# Patient Record
Sex: Female | Born: 1970
Health system: Southern US, Community
[De-identification: ages and names within clinical notes are randomized; demographics above are authoritative.]

## PROBLEM LIST (undated history)

## (undated) DIAGNOSIS — D6852 Prothrombin gene mutation: Secondary | ICD-10-CM

## (undated) DIAGNOSIS — I2699 Other pulmonary embolism without acute cor pulmonale: Secondary | ICD-10-CM

## (undated) DIAGNOSIS — E70329 Oculocutaneous albinism, unspecified: Secondary | ICD-10-CM

## (undated) DIAGNOSIS — D689 Coagulation defect, unspecified: Secondary | ICD-10-CM

## (undated) DIAGNOSIS — I80209 Phlebitis and thrombophlebitis of unspecified deep vessels of unspecified lower extremity: Secondary | ICD-10-CM

## (undated) DIAGNOSIS — G43009 Migraine without aura, not intractable, without status migrainosus: Secondary | ICD-10-CM

## (undated) DIAGNOSIS — E538 Deficiency of other specified B group vitamins: Secondary | ICD-10-CM

## (undated) DIAGNOSIS — J329 Chronic sinusitis, unspecified: Secondary | ICD-10-CM

## (undated) DIAGNOSIS — Z22322 Carrier or suspected carrier of Methicillin resistant Staphylococcus aureus: Secondary | ICD-10-CM

## (undated) DIAGNOSIS — G8929 Other chronic pain: Secondary | ICD-10-CM

## (undated) DIAGNOSIS — G47419 Narcolepsy without cataplexy: Secondary | ICD-10-CM

## (undated) DIAGNOSIS — G4700 Insomnia, unspecified: Secondary | ICD-10-CM

## (undated) DIAGNOSIS — R51 Headache: Secondary | ICD-10-CM

## (undated) DIAGNOSIS — Z86718 Personal history of other venous thrombosis and embolism: Secondary | ICD-10-CM

## (undated) DIAGNOSIS — G894 Chronic pain syndrome: Secondary | ICD-10-CM

## (undated) DIAGNOSIS — G500 Trigeminal neuralgia: Secondary | ICD-10-CM

## (undated) DIAGNOSIS — F419 Anxiety disorder, unspecified: Secondary | ICD-10-CM

## (undated) DIAGNOSIS — J322 Chronic ethmoidal sinusitis: Secondary | ICD-10-CM

## (undated) DIAGNOSIS — F988 Other specified behavioral and emotional disorders with onset usually occurring in childhood and adolescence: Secondary | ICD-10-CM

## (undated) DIAGNOSIS — E039 Hypothyroidism, unspecified: Secondary | ICD-10-CM

## (undated) DIAGNOSIS — Z923 Personal history of irradiation: Secondary | ICD-10-CM

## (undated) HISTORY — PX: EYE SURGERY: SHX253

## (undated) HISTORY — PX: ABDOMINAL HYSTERECTOMY: SHX81

## (undated) HISTORY — DX: Personal history of irradiation: Z92.3

## (undated) HISTORY — DX: Hypothyroidism, unspecified: E03.9

## (undated) HISTORY — DX: Insomnia, unspecified: G47.00

## (undated) HISTORY — DX: Coagulation defect, unspecified: D68.9

## (undated) HISTORY — DX: Chronic sinusitis, unspecified: J32.9

## (undated) HISTORY — DX: Other pulmonary embolism without acute cor pulmonale: I26.99

## (undated) HISTORY — DX: Chronic pain syndrome: G89.4

## (undated) HISTORY — DX: Migraine without aura, not intractable, without status migrainosus: G43.009

## (undated) HISTORY — DX: Narcolepsy without cataplexy: G47.419

## (undated) HISTORY — DX: Phlebitis and thrombophlebitis of unspecified deep vessels of unspecified lower extremity: I80.209

## (undated) HISTORY — DX: Deficiency of other specified B group vitamins: E53.8

## (undated) HISTORY — DX: Anxiety disorder, unspecified: F41.9

## (undated) HISTORY — DX: Oculocutaneous albinism, unspecified: E70.329

## (undated) HISTORY — DX: Other specified behavioral and emotional disorders with onset usually occurring in childhood and adolescence: F98.8

## (undated) HISTORY — DX: Chronic ethmoidal sinusitis: J32.2

## (undated) HISTORY — DX: Prothrombin gene mutation: D68.52

## (undated) HISTORY — DX: Personal history of other venous thrombosis and embolism: Z86.718

## (undated) HISTORY — PX: NASAL SINUS SURGERY: SHX719

## (undated) HISTORY — DX: Trigeminal neuralgia: G50.0

---

## 2004-02-24 ENCOUNTER — Other Ambulatory Visit: Admission: RE | Admit: 2004-02-24 | Discharge: 2004-02-24 | Payer: Self-pay | Admitting: *Deleted

## 2004-05-05 ENCOUNTER — Ambulatory Visit: Payer: Self-pay | Admitting: Oncology

## 2004-06-23 ENCOUNTER — Ambulatory Visit: Payer: Self-pay | Admitting: Oncology

## 2004-07-18 ENCOUNTER — Ambulatory Visit (HOSPITAL_COMMUNITY): Admission: RE | Admit: 2004-07-18 | Discharge: 2004-07-18 | Payer: Self-pay | Admitting: Oncology

## 2004-08-11 ENCOUNTER — Ambulatory Visit: Payer: Self-pay | Admitting: Dentistry

## 2004-08-11 ENCOUNTER — Encounter: Admission: RE | Admit: 2004-08-11 | Discharge: 2004-08-11 | Payer: Self-pay | Admitting: Dentistry

## 2004-08-31 ENCOUNTER — Ambulatory Visit: Payer: Self-pay | Admitting: Oncology

## 2004-10-24 ENCOUNTER — Ambulatory Visit: Payer: Self-pay | Admitting: Oncology

## 2004-12-21 ENCOUNTER — Ambulatory Visit: Payer: Self-pay | Admitting: Oncology

## 2005-01-26 ENCOUNTER — Other Ambulatory Visit: Admission: RE | Admit: 2005-01-26 | Discharge: 2005-01-26 | Payer: Self-pay | Admitting: Obstetrics and Gynecology

## 2005-02-16 ENCOUNTER — Encounter: Admission: RE | Admit: 2005-02-16 | Discharge: 2005-02-16 | Payer: Self-pay | Admitting: Oncology

## 2005-02-22 ENCOUNTER — Ambulatory Visit: Payer: Self-pay | Admitting: Oncology

## 2005-02-23 ENCOUNTER — Encounter: Admission: AD | Admit: 2005-02-23 | Discharge: 2005-02-23 | Payer: Self-pay | Admitting: Dentistry

## 2005-02-23 ENCOUNTER — Ambulatory Visit: Payer: Self-pay | Admitting: Dentistry

## 2005-07-06 ENCOUNTER — Ambulatory Visit: Payer: Self-pay | Admitting: Oncology

## 2005-10-05 ENCOUNTER — Ambulatory Visit: Payer: Self-pay | Admitting: Oncology

## 2006-01-25 ENCOUNTER — Ambulatory Visit: Payer: Self-pay | Admitting: Oncology

## 2006-04-26 ENCOUNTER — Ambulatory Visit: Payer: Self-pay | Admitting: Oncology

## 2006-07-12 ENCOUNTER — Ambulatory Visit: Payer: Self-pay | Admitting: Oncology

## 2006-10-04 ENCOUNTER — Ambulatory Visit: Payer: Self-pay | Admitting: Oncology

## 2007-01-01 ENCOUNTER — Ambulatory Visit: Payer: Self-pay | Admitting: Oncology

## 2007-10-13 ENCOUNTER — Ambulatory Visit (HOSPITAL_BASED_OUTPATIENT_CLINIC_OR_DEPARTMENT_OTHER): Admission: RE | Admit: 2007-10-13 | Discharge: 2007-10-16 | Payer: Self-pay

## 2007-10-13 ENCOUNTER — Encounter: Payer: Self-pay | Admitting: Internal Medicine

## 2007-10-14 ENCOUNTER — Encounter: Payer: Self-pay | Admitting: Internal Medicine

## 2007-10-16 ENCOUNTER — Ambulatory Visit: Payer: Self-pay | Admitting: Internal Medicine

## 2008-02-01 ENCOUNTER — Emergency Department (HOSPITAL_BASED_OUTPATIENT_CLINIC_OR_DEPARTMENT_OTHER): Admission: EM | Admit: 2008-02-01 | Discharge: 2008-02-02 | Payer: Self-pay | Admitting: Emergency Medicine

## 2008-02-02 ENCOUNTER — Ambulatory Visit: Payer: Self-pay | Admitting: Diagnostic Radiology

## 2008-02-14 ENCOUNTER — Emergency Department (HOSPITAL_BASED_OUTPATIENT_CLINIC_OR_DEPARTMENT_OTHER): Admission: EM | Admit: 2008-02-14 | Discharge: 2008-02-14 | Payer: Self-pay | Admitting: Emergency Medicine

## 2008-02-19 ENCOUNTER — Ambulatory Visit: Payer: Self-pay | Admitting: Diagnostic Radiology

## 2008-02-19 ENCOUNTER — Emergency Department (HOSPITAL_BASED_OUTPATIENT_CLINIC_OR_DEPARTMENT_OTHER): Admission: EM | Admit: 2008-02-19 | Discharge: 2008-02-19 | Payer: Self-pay | Admitting: Emergency Medicine

## 2008-03-07 ENCOUNTER — Emergency Department (HOSPITAL_BASED_OUTPATIENT_CLINIC_OR_DEPARTMENT_OTHER): Admission: EM | Admit: 2008-03-07 | Discharge: 2008-03-07 | Payer: Self-pay | Admitting: Emergency Medicine

## 2008-04-15 ENCOUNTER — Emergency Department (HOSPITAL_BASED_OUTPATIENT_CLINIC_OR_DEPARTMENT_OTHER): Admission: EM | Admit: 2008-04-15 | Discharge: 2008-04-15 | Payer: Self-pay | Admitting: Emergency Medicine

## 2008-04-25 ENCOUNTER — Emergency Department (HOSPITAL_BASED_OUTPATIENT_CLINIC_OR_DEPARTMENT_OTHER): Admission: EM | Admit: 2008-04-25 | Discharge: 2008-04-25 | Payer: Self-pay | Admitting: Emergency Medicine

## 2008-04-29 ENCOUNTER — Emergency Department (HOSPITAL_BASED_OUTPATIENT_CLINIC_OR_DEPARTMENT_OTHER): Admission: EM | Admit: 2008-04-29 | Discharge: 2008-04-29 | Payer: Self-pay | Admitting: Emergency Medicine

## 2008-06-11 ENCOUNTER — Emergency Department (HOSPITAL_BASED_OUTPATIENT_CLINIC_OR_DEPARTMENT_OTHER): Admission: EM | Admit: 2008-06-11 | Discharge: 2008-06-11 | Payer: Self-pay | Admitting: Emergency Medicine

## 2009-02-06 IMAGING — CT CT PARANASAL SINUSES LIMITED
3 series · 16 of 47 positions shown, 19 images · non-contrast
Comparison: 07/18/2004

CLINICAL DATA: Headache

CT PARANASAL SINUS LIMITED WITHOUT CONTRAST

[Series 2: limited sinus 3.0 h60s · axial · 0.34mm/px · z∈[-140,-53]mm · 10 of 35 slices shown, 13 images]
[im 3/35  brain]
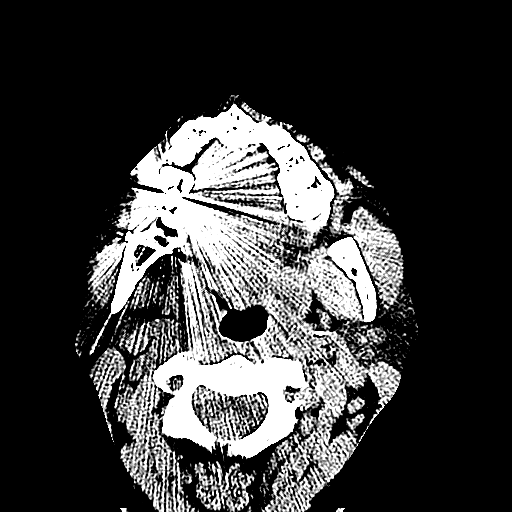
[im 3/35  bone]
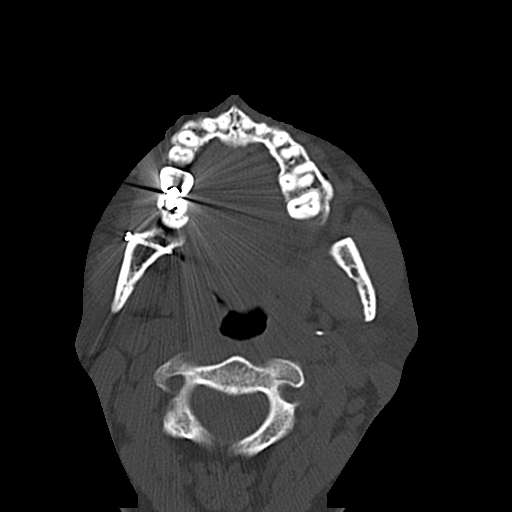
[im 6/35  bone]
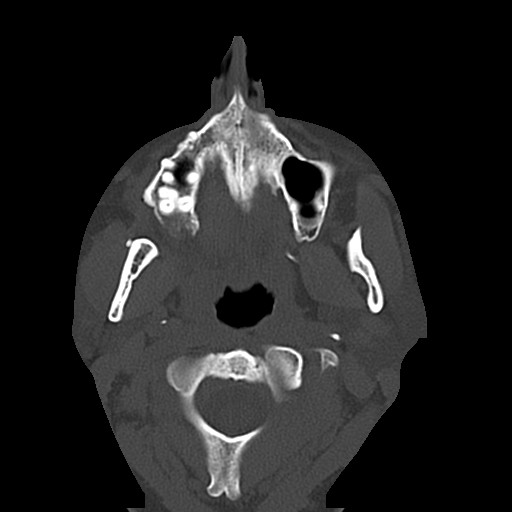
[im 10/35  bone]
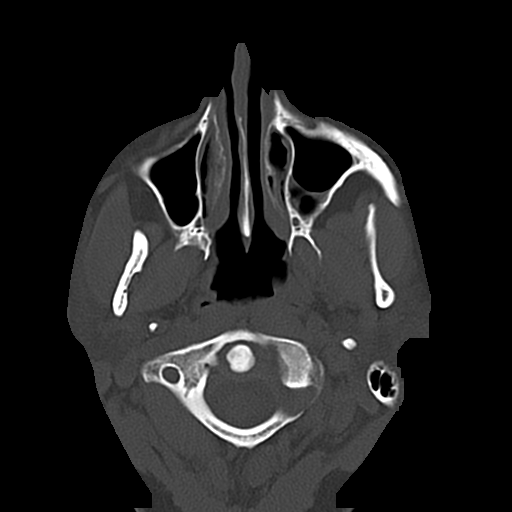
[im 12/35  bone]
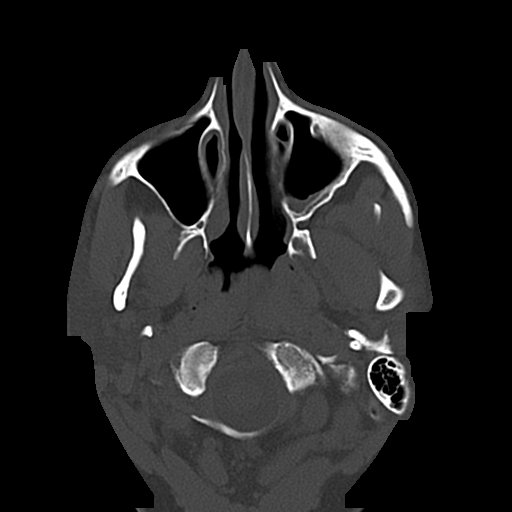
[im 16/35  brain]
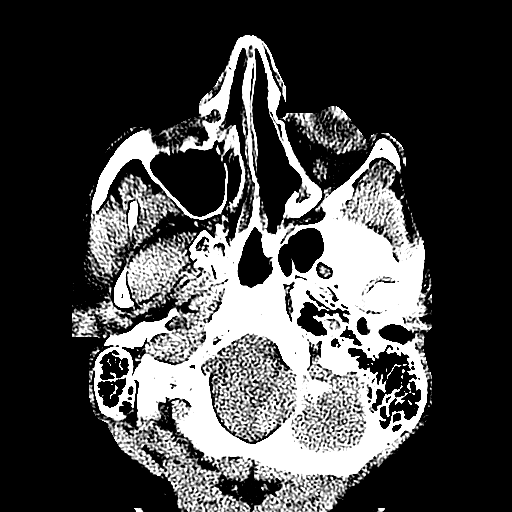
[im 16/35  bone]
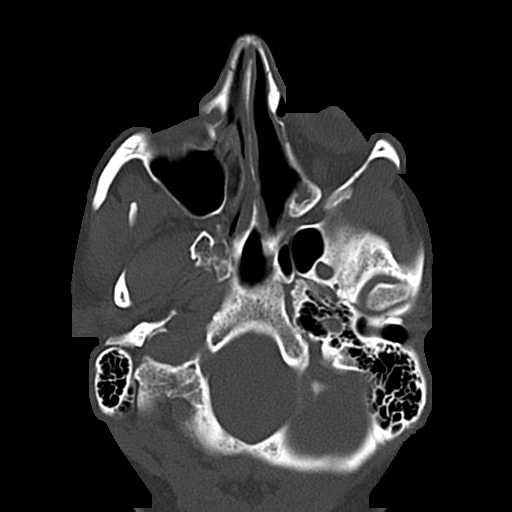
[im 19/35  bone]
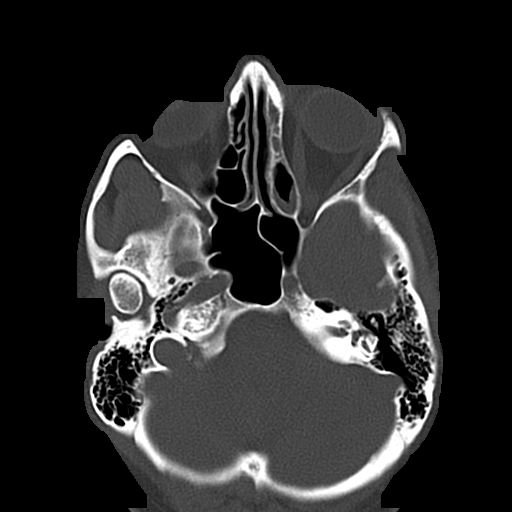
[im 23/35  bone]
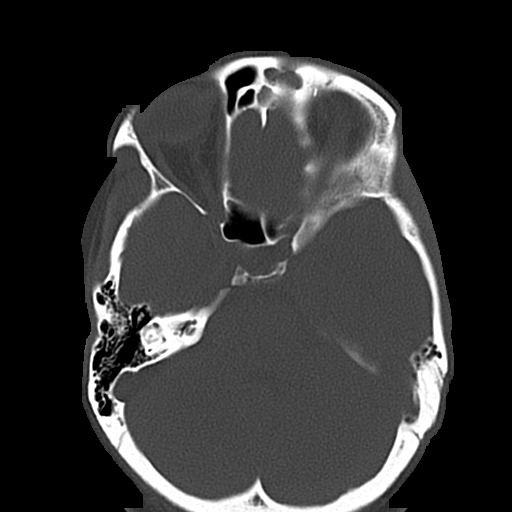
[im 26/35  bone]
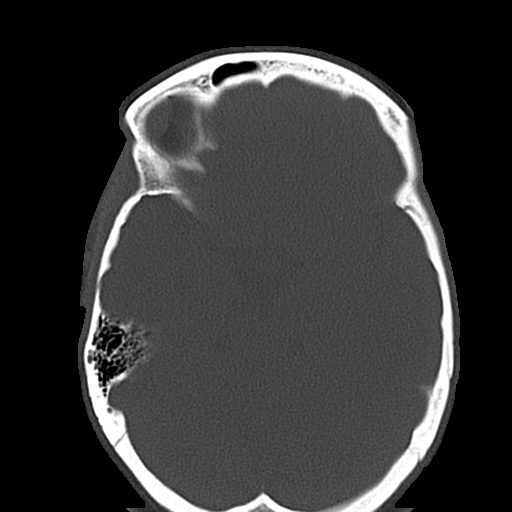
[im 29/35  brain]
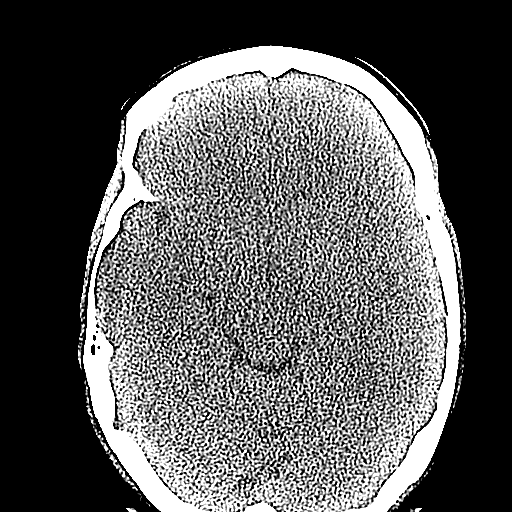
[im 29/35  bone]
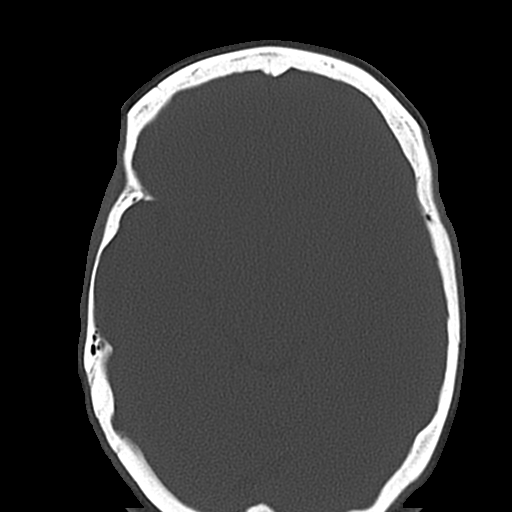
[im 32/35  bone]
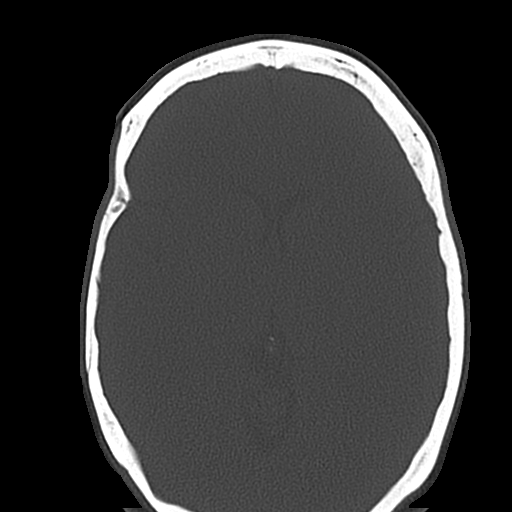

[Series 602: sagittal · sagittal · 0.34mm/px · 3 of 45 slices shown]
[im 15/45  bone]
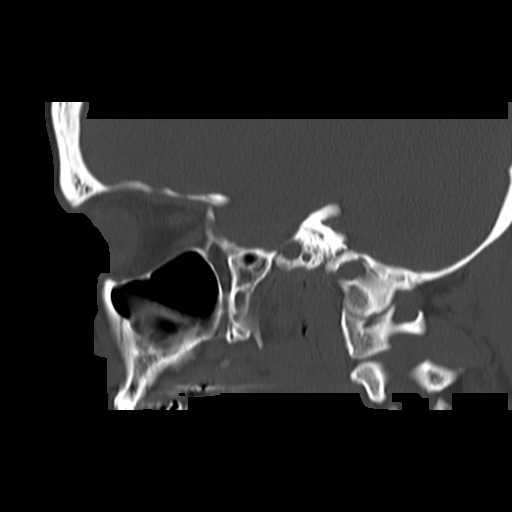
[im 23/45  bone]
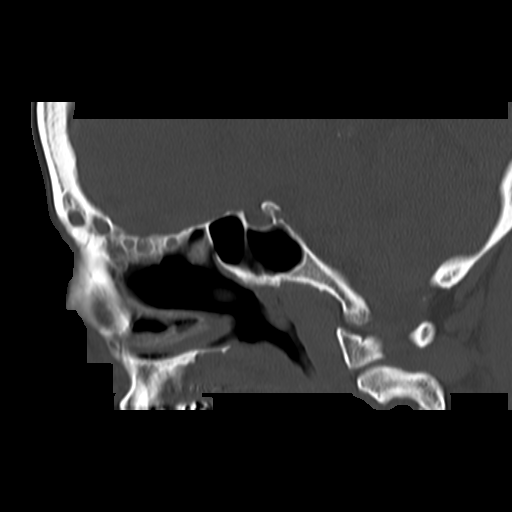
[im 30/45  bone]
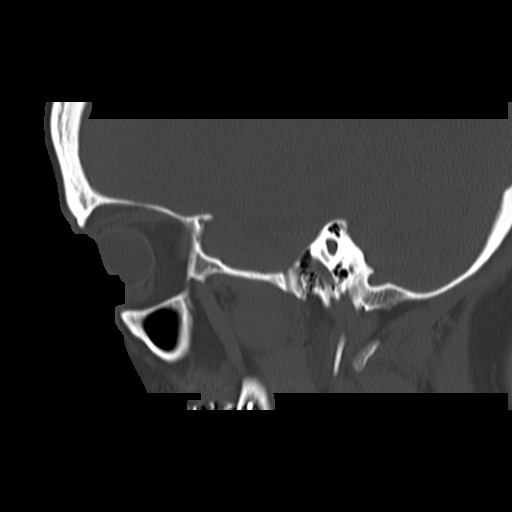

[Series 603: coronal · coronal · 0.34mm/px · 3 of 50 slices shown]
[im 17/50  bone]
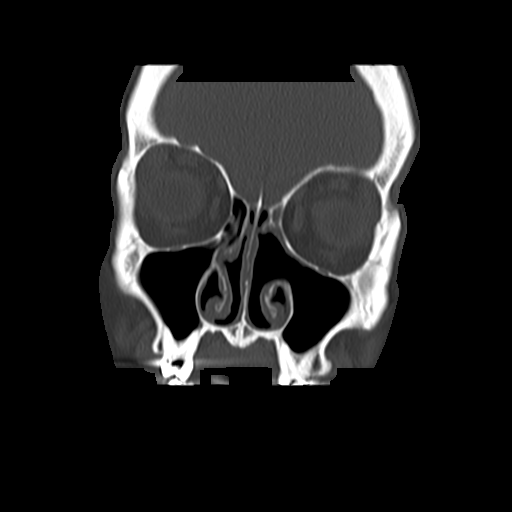
[im 22/50  bone]
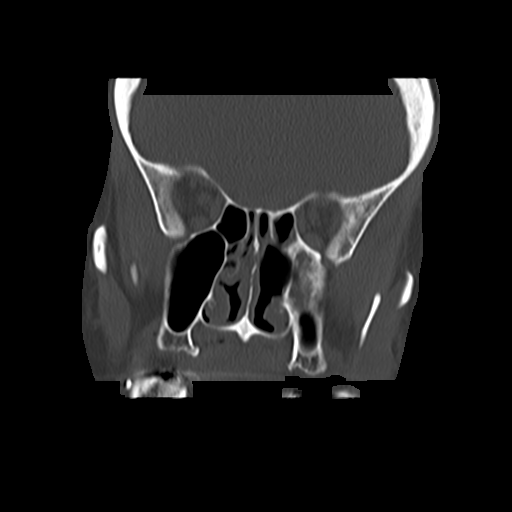
[im 28/50  bone]
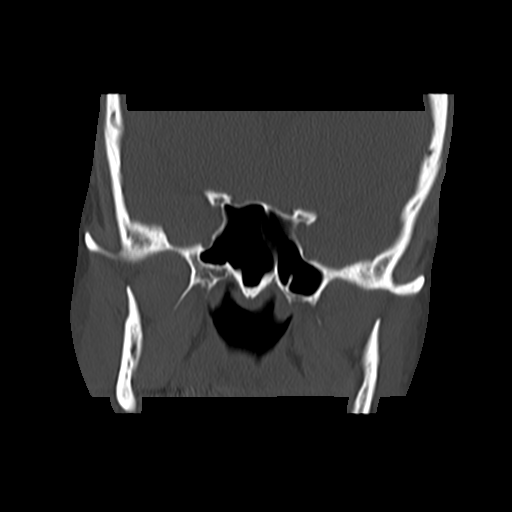

[16 of 47 positions shown; findings below may reference images not displayed]

FINDINGS: The wound was mastoid air cells and middle ear cavities
are clear.  The patient does not have frontal sinuses.  There is
chronic appearing left ethmoid sinus disease.  Sphenoid sinus is
clear.  There are surgical changes involving the left maxillary
sinus with wide communication between the sinus and the nasal
cavity.  Chronic-appearing sinus disease with mucoperiosteal
thickening and sinus wall thickening.  No findings for acute
sinusitis.  The globes are intact.  The visualized brain appears
normal.
IMPRESSION: 1.  Chronic left maxillary and ethmoid sinus disease with surgical
changes as noted above.  No findings for acute sinusitis.
2.  Clear mastoid air cells and middle ear cavities.

## 2009-02-24 IMAGING — CR DG CHEST 2V
2 series · 2 of 2 positions shown · non-contrast
Comparison: None

CLINICAL DATA: Shortness of breath with cough and congestion.
Question pneumonia.

CHEST - 2 VIEW

[w chest pa]
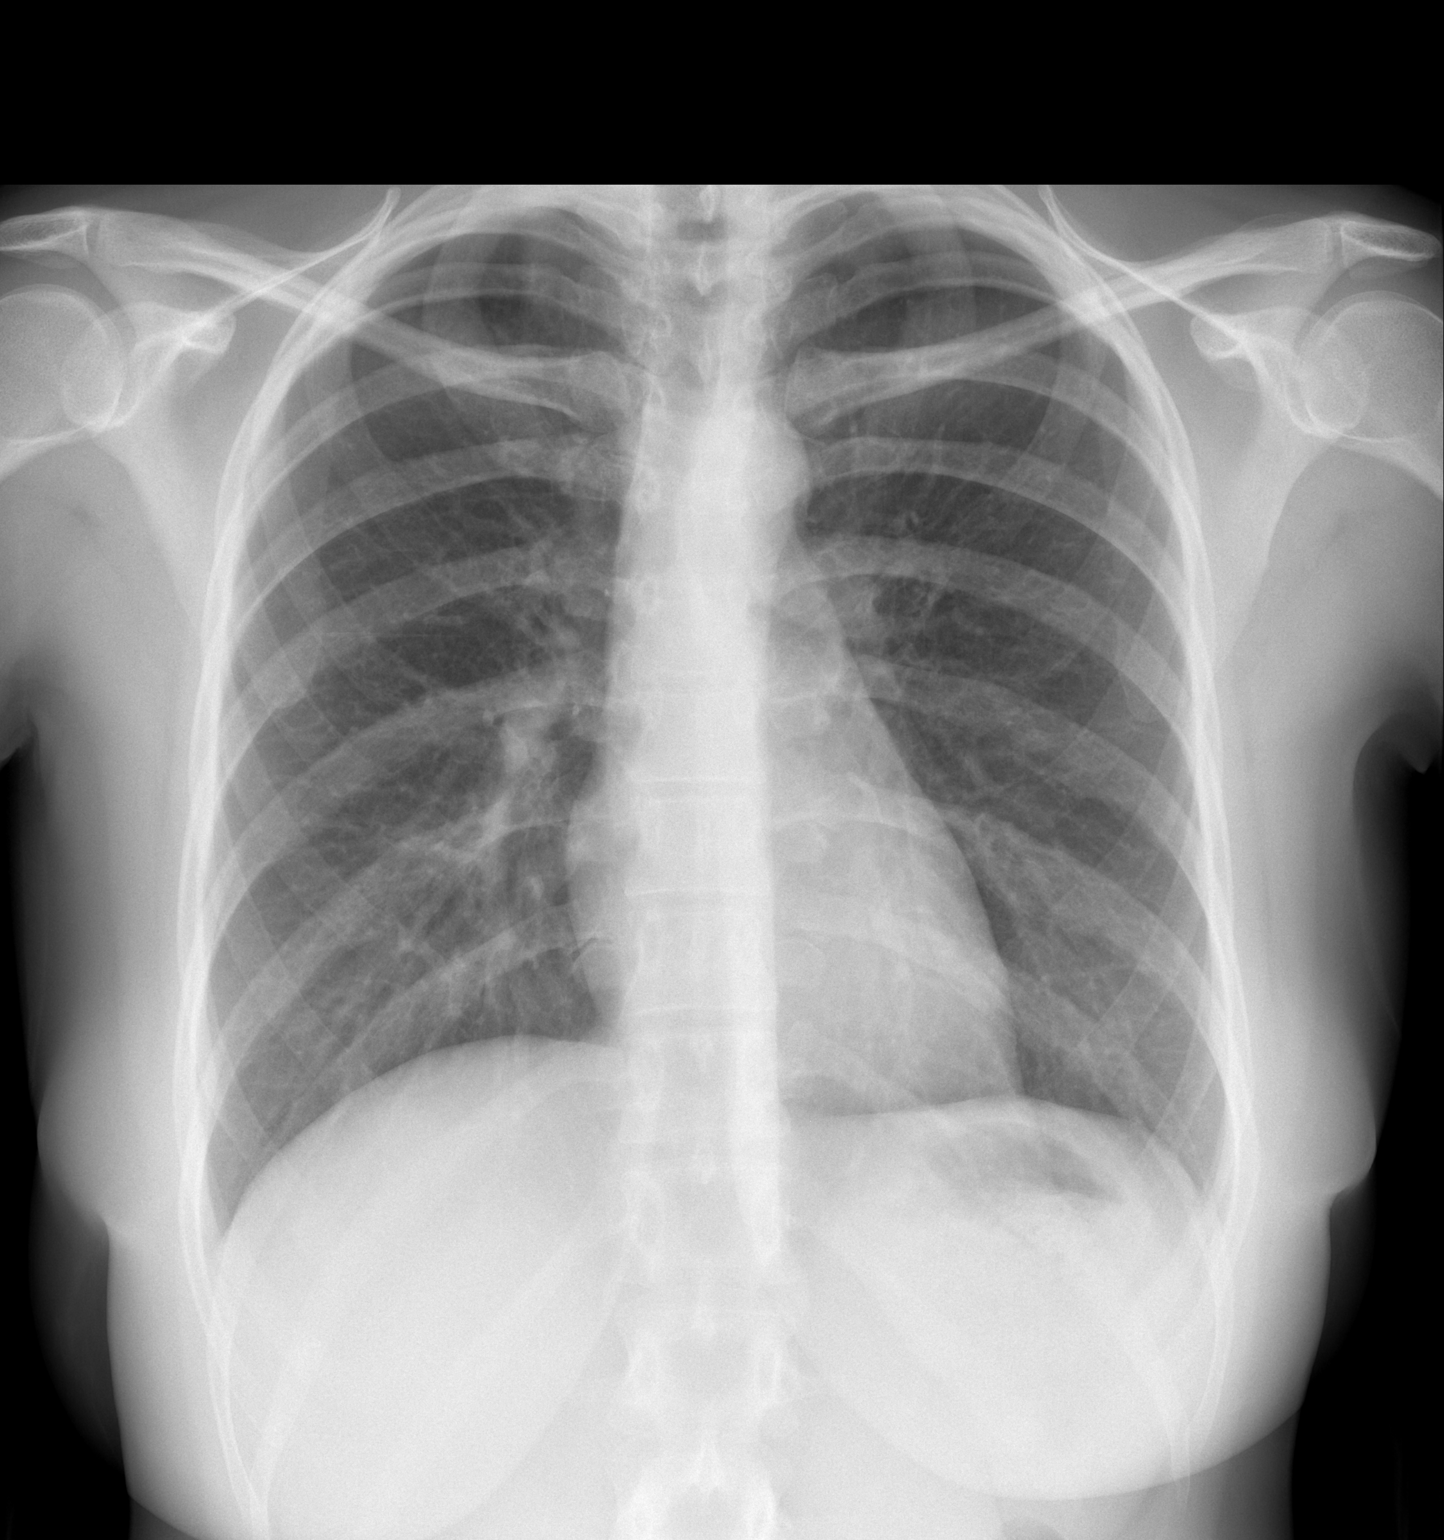

[w chest lat]
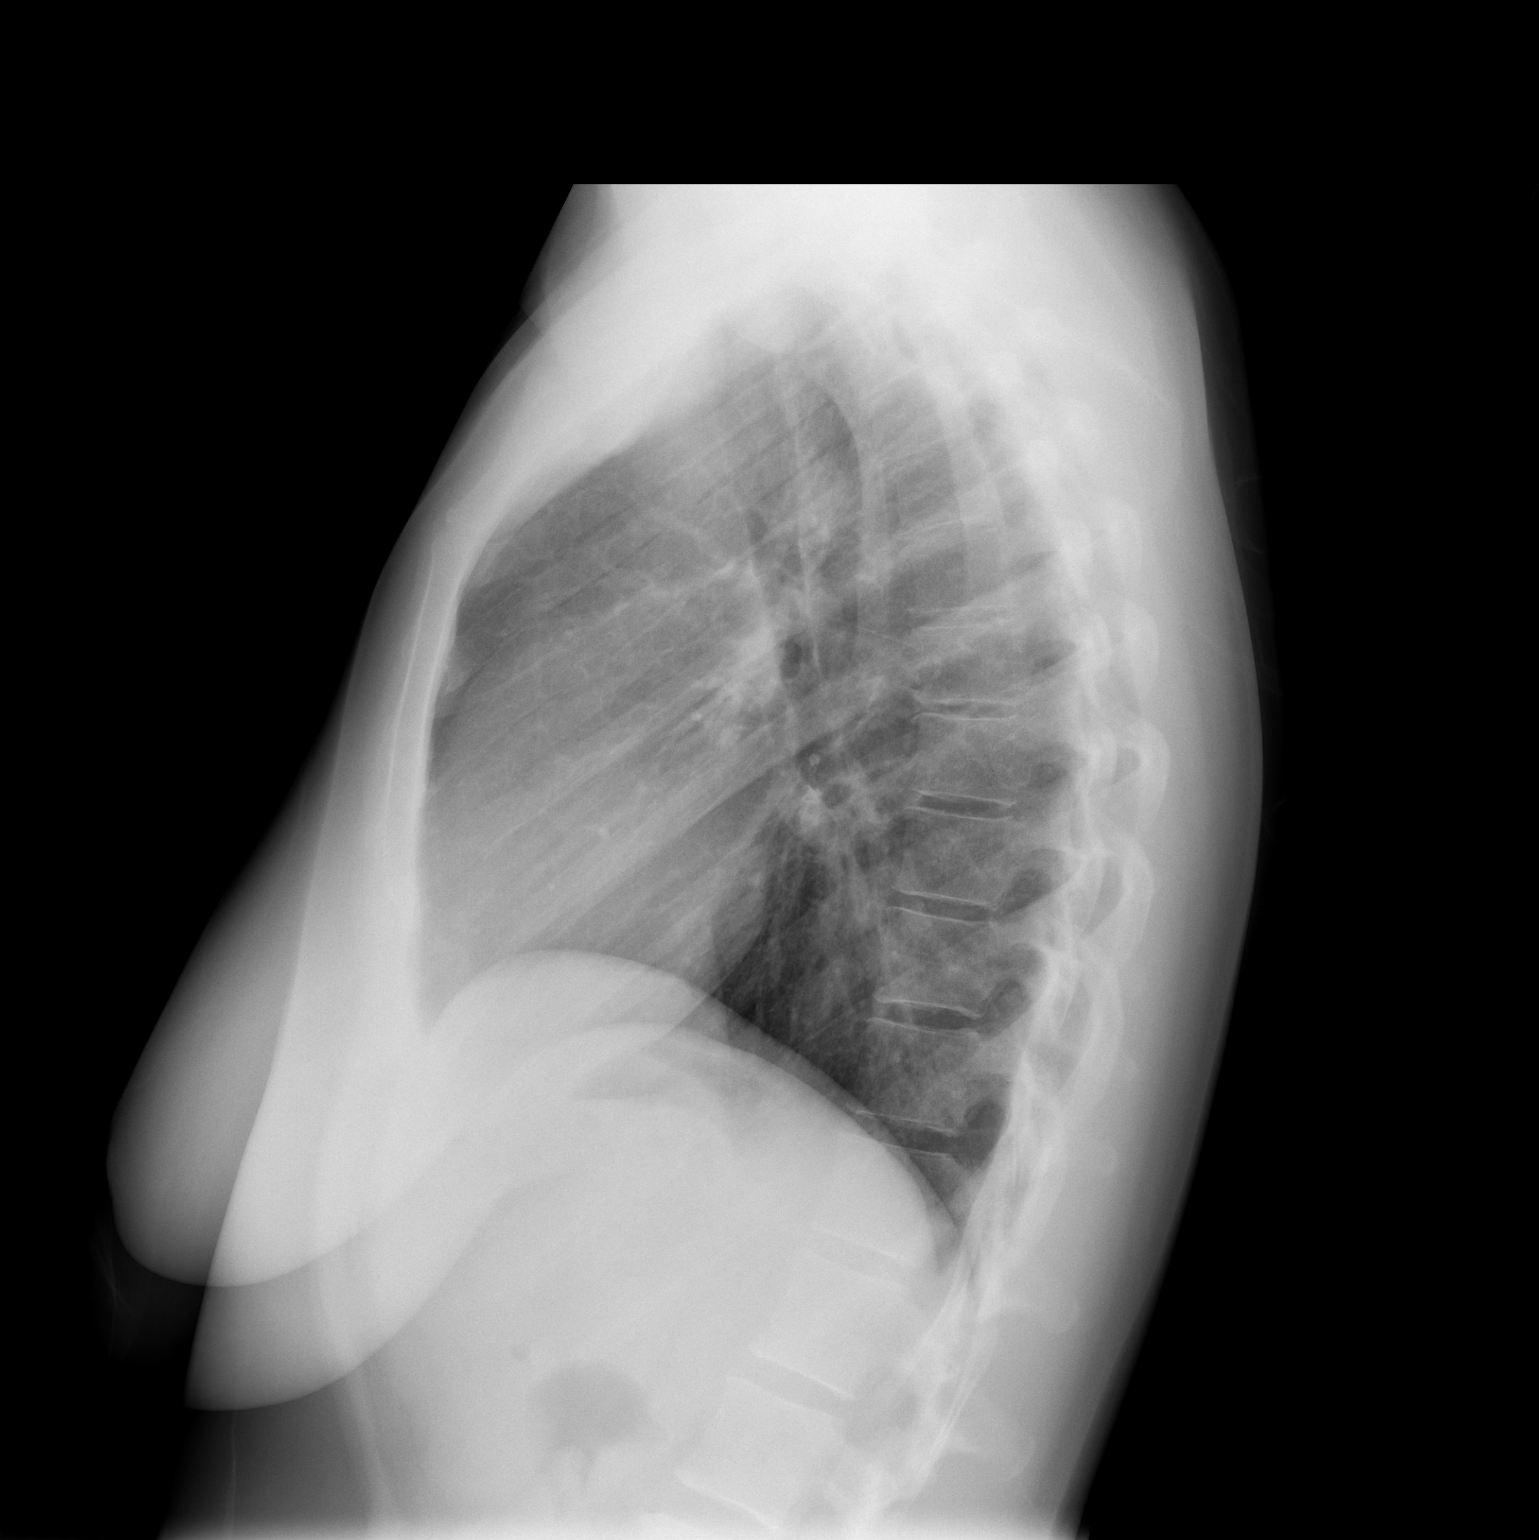

[2 of 2 positions shown; findings below may reference images not displayed]

FINDINGS: The heart size and mediastinal contours are normal.  The
lungs are clear.  There is no pleural effusion or pneumothorax.  No
osseous abnormalities are apparent.
IMPRESSION: No evidence of pneumonia or other active cardiopulmonary process.

## 2010-05-12 ENCOUNTER — Ambulatory Visit (INDEPENDENT_AMBULATORY_CARE_PROVIDER_SITE_OTHER): Payer: Self-pay | Admitting: Infectious Diseases

## 2010-05-12 ENCOUNTER — Encounter: Payer: Self-pay | Admitting: Infectious Diseases

## 2010-05-12 DIAGNOSIS — J322 Chronic ethmoidal sinusitis: Secondary | ICD-10-CM | POA: Insufficient documentation

## 2010-05-12 DIAGNOSIS — G43909 Migraine, unspecified, not intractable, without status migrainosus: Secondary | ICD-10-CM | POA: Insufficient documentation

## 2010-05-12 DIAGNOSIS — G4700 Insomnia, unspecified: Secondary | ICD-10-CM

## 2010-05-12 DIAGNOSIS — F329 Major depressive disorder, single episode, unspecified: Secondary | ICD-10-CM

## 2010-05-12 DIAGNOSIS — J301 Allergic rhinitis due to pollen: Secondary | ICD-10-CM

## 2010-05-12 DIAGNOSIS — F32A Depression, unspecified: Secondary | ICD-10-CM | POA: Insufficient documentation

## 2010-05-12 NOTE — Progress Notes (Signed)
  Subjective:    Patient ID: Cynthia Hester, female    DOB: [birth year], 40 y.o.   MRN: [id number]  HPI 40 yo F with hx of chronic Left sided sinusitis. She began to have severe pain July 2008. She was diagnosed with allergic fungal sinusitis then and had the wall of one of her sinuses removed. She was treated with 1 month of intranasal ampho. She had a Cx that grew Mucor. She had a drain after puncture of her frontal sinus. She continued to drain and was sent to [facility]. She underwent sinus revision then (septum) and was felt to be massively infected. Her Cx from that time grew MRSA and Pseudomonas.  She developed fever in hospital which was attributed to Vanco in the hospital. She was treated with bactroban, cipro, other medications afterwards.   Had closure of her sinus which held until it ruptured 2010 and she was admitted to the hospital, drained, and eventually had obliteration of her frontal sinus with fat.  She continues to have recurrent infections, green d/c. She has anosmia.   She was taken to OR on 04-05-10 where she underwent Cx's and irrigation. These Cx were negative/routine flora. Fungal Cx negative.  She had sinus Cx done in ENTs office on 04-24-10 which showed MRSA (S-Vanco, Tet, Rif, linezolid) and Serratia marcescens (R-cefuroxime, cefazolin).  She continues to have "chunky d/c". Was given 1g ceftriaxone x 3 days IM, bactroban and doxy.  Feels "sick", has hot flashes, fatigue. Having sweats, no fevers. Has lost her appetite, has not lost weight.     Review of Systems  HENT: Positive for facial swelling.   Eyes: Positive for pain.  Gastrointestinal: Positive for constipation. Negative for diarrhea.  Genitourinary: Negative for vaginal discharge and vaginal pain.  Neurological: Positive for headaches.  L eye- feels like pressure, occasionally painful to open.      Objective:   Physical Exam  Constitutional: She appears well-developed and well-nourished.  HENT:  Head:  Normocephalic. Head is without raccoon's eyes and without contusion.  Nose: Mucosal edema present. No nasal deformity. No epistaxis.  No foreign bodies. Right sinus exhibits no maxillary sinus tenderness and no frontal sinus tenderness. Left sinus exhibits no maxillary sinus tenderness and no frontal sinus tenderness.    Eyes: EOM are normal. Pupils are equal, round, and reactive to light.  Neck: Neck supple.  Cardiovascular: Normal rate, regular rhythm and normal heart sounds.   Pulmonary/Chest: Effort normal and breath sounds normal. She has no wheezes.  Abdominal: Soft. Bowel sounds are normal. She exhibits no distension. There is no tenderness.  Musculoskeletal:       Right ankle: She exhibits no swelling.       Left ankle: She exhibits no swelling.          Assessment & Plan:

## 2010-05-12 NOTE — Assessment & Plan Note (Signed)
Spoke at length with pat, dad, and fiance about her illness. Will change her anbx to Bactrim Ds bid for 1 month. I am not sure how helpful this will be (vs continuing doxy) but this may have better penetration. I stressed to her the difficulty in trying to treat this infection given her abnormal anatomy. I do not think that currently she has a fungal infection in her sinuses- she does not have cultures or a biopsy to support this. We spoke about possibly putting her IV anbx but will hold at this point as she seems to be doing fairly well. She will stop the intranasal bactroban as I don't think it adds anything to systemic therapy. She asks about cure of MRSA. We will try to irradicate her carrier states after completes her po therapy- intranasal bactroban bid for the first 5 days of the month for 6 months, topical CHG baths.  She will rtc in 3 weeks

## 2010-05-31 LAB — URINE MICROSCOPIC-ADD ON

## 2010-05-31 LAB — DIFFERENTIAL
Lymphocytes Relative: 18 % (ref 12–46)
Lymphs Abs: 1.3 10*3/uL (ref 0.7–4.0)
Monocytes Relative: 3 % (ref 3–12)
Neutro Abs: 6 10*3/uL (ref 1.7–7.7)
Neutrophils Relative %: 78 % — ABNORMAL HIGH (ref 43–77)

## 2010-05-31 LAB — PREGNANCY, URINE: Preg Test, Ur: NEGATIVE

## 2010-05-31 LAB — COMPREHENSIVE METABOLIC PANEL
CO2: 25 mEq/L (ref 19–32)
Calcium: 9.8 mg/dL (ref 8.4–10.5)
Chloride: 102 mEq/L (ref 96–112)
Creatinine, Ser: 0.7 mg/dL (ref 0.4–1.2)
GFR calc Af Amer: >60 mL/min (ref >60)
GFR calc non Af Amer: >60 mL/min (ref >60)
Glucose, Bld: 127 mg/dL — ABNORMAL HIGH (ref 70–99)
Potassium: 4.7 mEq/L (ref 3.5–5.1)
Sodium: 138 mEq/L (ref 135–145)
Total Protein: 8.1 g/dL (ref 6.0–8.3)

## 2010-05-31 LAB — CBC
HCT: 41.3 % (ref 36.0–46.0)
Platelets: 211 10*3/uL (ref 150–400)
WBC: 7.6 10*3/uL (ref 4.0–10.5)

## 2010-05-31 LAB — URINALYSIS, ROUTINE W REFLEX MICROSCOPIC
Bilirubin Urine: NEGATIVE
Leukocytes, UA: NEGATIVE
Nitrite: NEGATIVE
Specific Gravity, Urine: 1.02 (ref 1.005–1.030)
Urobilinogen, UA: 0.2 mg/dL (ref 0.0–1.0)

## 2010-05-31 LAB — LIPASE, BLOOD: Lipase: 43 U/L (ref 23–300)

## 2010-06-07 ENCOUNTER — Ambulatory Visit: Payer: PRIVATE HEALTH INSURANCE | Admitting: Infectious Diseases

## 2010-07-04 NOTE — Procedures (Signed)
Cynthia Hester, Cynthia Hester            ACCOUNT NO.:  [id number]   MEDICAL RECORD NO.:  [id number]          PATIENT TYPE:  OUT   LOCATION:  SLEEP CENTER                 FACILITY:  [facility]   PHYSICIAN:  Clinton D. Maple Hudson, MD, FCCP, FACPDATE OF BIRTH:  [birth year]-Dec[birth year]1968   DATE OF STUDY:  10/14/2007                          MULTIPLE SLEEP LATENCY TEST   REFERRING PHYSICIAN:  Sallye Ober, M.D.   INDICATION FOR STUDY:  Hypersomnia, question narcolepsy without  cataplexy, morning headache.  See results of NPSG from the previous  night, recording AHI of zero, total sleep time 314.5 minutes with 4%  REM, sleep efficiency of 74.5%.   Bedtime medications included Klonopin, Depakote, potassium and Percocet  by 2315 p.m. on October 13, 2007.  The patient has been off of Provigil  after October 13, 2007 a.m.   Epworth sleepiness score 12/24.  BMI 22, height 5 feet 4 inches, weight  130 pounds. neck 11.5 inches.   During this MSLT study, the patient took Percocet for headache at 0950  and at 1248 for headache.   Nap #1 - 8:00 a.m.  Sleep latency 4 minutes, REM latency NA.   Nap #2 - 10:00 a.m.  Sleep latency 10.5 minutes, REM latency NA.   Nap #3 - 12 noon. Sleep latency 11 minutes, REM latency NA.   Nap #4 - 1400.  Sleep latency 5.5 minutes, REM latency NA.   Nap #5 - 1600.  Sleep latency 5 minutes, REM latency NA.   Mean Sleep Latency 7.2 minutes with no REM events.   IMPRESSION/RECOMMENDATIONS:  Excessive daytime somnolence.  Normal  adults have a mean sleep latency of 10 minutes or greater.  These  studies are easiest to interpret when patients are off all potential  confounding medication including sedatives, antidepressants which may  affect REM percentage and stimulants for at least a week prior to the  test.  In many cases, that is not practical, and the value of the test  lies in confirming the amount of sleepiness for correlation with the  patient's complaints.  Note that the patient took  Percocet twice during  this study.  That is likely to contribute to daytime somnolence.  It  also raises the possibility of analgesic rebound headache.  If this  degree of daytime somnolence is consistent with the patient's routine,  particular education should be directed towards responsibility for safe  driving and utilization of short daytime naps when possible as an  alternative or supplement to the use of stimulant medications.  This  study cannot exclude the possibility of a primary disorder of excessive  somnolence such as idiopathic hypersomnolence or narcolepsy.  Relative  deficiency of REM during  nighttime sleep and an absence of REM during the naps and in multiple  sleep latency tests may argue against narcolepsy, but may simply reflect  the REM suppressing effect of antidepressant therapy.      Clinton D. Maple Hudson, MD, [facility], FACP  Diplomate, Biomedical engineer of Sleep Medicine  Electronically Signed     CDY/MEDQ  D:  10/16/2007 08:22:36  T:  10/16/2007 10:01:37  Job:  [id number]

## 2010-07-04 NOTE — Procedures (Signed)
Cynthia Hester, Cynthia Hester            ACCOUNT NO.:  [id number]   MEDICAL RECORD NO.:  [id number]          PATIENT TYPE:  OUT   LOCATION:  SLEEP CENTER                 FACILITY:  [facility]   PHYSICIAN:  Clinton D. Maple Hudson, MD, FCCP, FACPDATE OF BIRTH:  [birth year]-Aug-1968   DATE OF STUDY:  10/13/2007                            NOCTURNAL POLYSOMNOGRAM   REFERRING PHYSICIAN:  RUBEN CINTRON   INDICATION FOR STUDY:  Hypersomnia with sleep apnea.  Nonrestorative  sleep.  Morning headache.   EPWORTH SLEEPINESS SCORE:  12/24. BMI 22.3.  Weight 130 pounds.  Height  64 inches.  Neck 11.5 inches.   MEDICATIONS:  Home medications charted and reviewed and include a number  of medications significantly capable of effecting sleep and alertness.   SLEEP ARCHITECTURE:  Total sleep time 314.5 minutes with sleep  efficiency 74.5%.  Stage I was 8.3%, stage II 87.8%, stage III absent.  REM 4% of total sleep time.  Sleep latency 28.5 minutes.  REM latency  373.5 minutes.  Awake after sleep onset 80.5 minutes.  Arousal index  6.3.  Bedtime medication included Klonopin, Depakote, K-Dur and  Percocet.  Sleep pattern was marked by dominance of stage II with  frequent nonspecific waking's throughout the night and a single REM  interval between 5 and 6 A.M.   RESPIRATORY DATA:  Apnea/hypopnea index (AHI) 0 per hour, respiratory  disturbance index (RDI) 0.4 per hour.  No significant events met scoring  criteria.   OXYGEN DATA:  Absent to minimal snoring with oxygen desaturation nadir  92%.  Mean oxygen saturation on room air was 96.5% through the study.   CARDIAC DATA:  Normal sinus rhythm.   MOVEMENT-PARASOMNIA:  No significant movement disturbance.  No bathroom  trips.  She slept with her face covered to keep out light.  She  described her sleep as deep but interrupted, better than usual.   IMPRESSIONS-RECOMMENDATIONS:  1. Sleep architecture marked by frequent  brief waking's and      diminished time spent in REM,  which can be a medication effect but      is nonspecific on a single night study.  2. No significant respiratory or movement disturbance of sleep.  3. See multiple sleep latency test report.      Clinton D. Maple Hudson, MD, [facility], FACP  Diplomate, Biomedical engineer of Sleep Medicine  Electronically Signed     CDY/MEDQ  D:  10/16/2007 08:09:25  T:  10/16/2007 09:44:10  Job:  [id number]

## 2010-07-07 ENCOUNTER — Encounter: Payer: Self-pay | Admitting: Infectious Diseases

## 2010-07-18 ENCOUNTER — Emergency Department (HOSPITAL_BASED_OUTPATIENT_CLINIC_OR_DEPARTMENT_OTHER)
Admission: EM | Admit: 2010-07-18 | Discharge: 2010-07-19 | Disposition: A | Discharge status: Home / Self Care | Payer: PRIVATE HEALTH INSURANCE | Attending: Emergency Medicine | Admitting: Emergency Medicine

## 2010-07-18 DIAGNOSIS — Z86718 Personal history of other venous thrombosis and embolism: Secondary | ICD-10-CM | POA: Insufficient documentation

## 2010-07-18 DIAGNOSIS — G500 Trigeminal neuralgia: Secondary | ICD-10-CM | POA: Insufficient documentation

## 2010-07-18 DIAGNOSIS — R51 Headache: Secondary | ICD-10-CM | POA: Insufficient documentation

## 2010-07-18 DIAGNOSIS — Z79899 Other long term (current) drug therapy: Secondary | ICD-10-CM | POA: Insufficient documentation

## 2012-04-20 ENCOUNTER — Emergency Department (HOSPITAL_BASED_OUTPATIENT_CLINIC_OR_DEPARTMENT_OTHER)
Admission: EM | Admit: 2012-04-20 | Discharge: 2012-04-20 | Disposition: A | Discharge status: Home / Self Care | Payer: BC Managed Care – PPO | Attending: Emergency Medicine | Admitting: Emergency Medicine

## 2012-04-20 ENCOUNTER — Encounter (HOSPITAL_BASED_OUTPATIENT_CLINIC_OR_DEPARTMENT_OTHER): Payer: Self-pay | Admitting: *Deleted

## 2012-04-20 DIAGNOSIS — R110 Nausea: Secondary | ICD-10-CM | POA: Insufficient documentation

## 2012-04-20 DIAGNOSIS — Z8709 Personal history of other diseases of the respiratory system: Secondary | ICD-10-CM | POA: Insufficient documentation

## 2012-04-20 DIAGNOSIS — H53149 Visual discomfort, unspecified: Secondary | ICD-10-CM | POA: Insufficient documentation

## 2012-04-20 DIAGNOSIS — G501 Atypical facial pain: Secondary | ICD-10-CM

## 2012-04-20 DIAGNOSIS — Z791 Long term (current) use of non-steroidal anti-inflammatories (NSAID): Secondary | ICD-10-CM | POA: Insufficient documentation

## 2012-04-20 DIAGNOSIS — Z79899 Other long term (current) drug therapy: Secondary | ICD-10-CM | POA: Insufficient documentation

## 2012-04-20 DIAGNOSIS — Z8614 Personal history of Methicillin resistant Staphylococcus aureus infection: Secondary | ICD-10-CM | POA: Insufficient documentation

## 2012-04-20 HISTORY — DX: Headache: R51

## 2012-04-20 HISTORY — DX: Other chronic pain: G89.29

## 2012-04-20 HISTORY — DX: Chronic sinusitis, unspecified: J32.9

## 2012-04-20 HISTORY — DX: Carrier or suspected carrier of methicillin resistant Staphylococcus aureus: Z22.322

## 2012-04-20 MED ORDER — HYDROMORPHONE HCL PF 1 MG/ML IJ SOLN
1.0000 mg | Freq: Once | INTRAMUSCULAR | Status: AC
  Administered 2012-04-20: 1 mg via INTRAVENOUS
  Filled 2012-04-20: qty 1

## 2012-04-20 MED ORDER — KETOROLAC TROMETHAMINE 15 MG/ML IJ SOLN
15.0000 mg | Freq: Once | INTRAMUSCULAR | Status: AC
  Administered 2012-04-20: 15 mg via INTRAVENOUS
  Filled 2012-04-20: qty 1

## 2012-04-20 MED ORDER — PROMETHAZINE HCL 25 MG/ML IJ SOLN
12.5000 mg | Freq: Once | INTRAMUSCULAR | Status: AC
  Administered 2012-04-20: 12.5 mg via INTRAVENOUS
  Filled 2012-04-20: qty 1

## 2012-04-20 MED ORDER — SODIUM CHLORIDE 0.9 % IV SOLN
INTRAVENOUS | Status: DC
  Administered 2012-04-20: 15:00:00 via INTRAVENOUS

## 2012-04-20 MED ORDER — ONDANSETRON HCL 4 MG/2ML IJ SOLN
4.0000 mg | Freq: Once | INTRAMUSCULAR | Status: AC
  Administered 2012-04-20: 4 mg via INTRAVENOUS
  Filled 2012-04-20: qty 2

## 2012-04-20 NOTE — ED Notes (Signed)
Pt has hx of "Complex H/A and trigeminal neuralgia" Has had this H/A x 3 days with nausea. PERL. Nystagmus noted "Normal" States she usually gets IV Toradol and Phenergan followed 20 min later by Dilaudid.

## 2012-04-20 NOTE — ED Provider Notes (Signed)
History     CSN: [id number]  Arrival date & time 04/20/12  1341   First MD Initiated Contact with Patient 04/20/12 1426      Chief Complaint  Patient presents with  . Headache   HPI Cynthia Hester is a 42 y.o. female who presents to the ED with headache. The headache started 3 days ago. She describes the headache as pain around the left eye. She has a history of trigeminal neuralgia. Since then she has occasional migraine type headaches. She has had nausea associated with the headache. The history was provided by the patient.  Past Medical History  Diagnosis Date  . Chronic headaches   . Trigeminal neuralgia   . MRSA (methicillin resistant staph aureus) culture positive   . Sinusitis     Past Surgical History  Procedure Laterality Date  . Abdominal hysterectomy    . Eye surgery      History reviewed. No pertinent family history.  History  Substance Use Topics  . Smoking status: Never Smoker   . Smokeless tobacco: Not on file  . Alcohol Use: No    OB History   Grav Para Term Preterm Abortions TAB SAB Ect Mult Living                  Review of Systems  Constitutional: Negative for fever, chills and fatigue.  HENT: Negative for ear pain, congestion, sore throat, facial swelling, neck pain, neck stiffness, dental problem and sinus pressure.   Eyes: Positive for photophobia. Negative for pain and discharge.  Respiratory: Negative for cough, chest tightness and wheezing.   Cardiovascular: Negative for chest pain.  Gastrointestinal: Positive for nausea. Negative for vomiting, abdominal pain, diarrhea, constipation and abdominal distention.  Genitourinary: Negative for dysuria, frequency, flank pain and difficulty urinating.  Musculoskeletal: Negative for myalgias, back pain and gait problem.  Skin: Negative for color change and rash.  Neurological: Positive for headaches. Negative for dizziness, speech difficulty, weakness, light-headedness and numbness.   Psychiatric/Behavioral: Negative for confusion and agitation. The patient is not nervous/anxious.     Allergies  Enoxaparin sodium; Metoclopramide hcl; and Vancomycin  Home Medications   Current Outpatient Rx  Name  Route  Sig  Dispense  Refill  . estradiol (VIVELLE-DOT) 0.05 MG/24HR   Transdermal   Place 1 patch onto the skin once a week.         . clonazePAM (KLONOPIN) 1 MG tablet   Oral   Take 1 mg by mouth 2 (two) times daily as needed.          . diazepam (VALIUM) 5 MG tablet   Oral   Take 5 mg by mouth once.           . meloxicam (MOBIC) 15 MG tablet   Oral   Take 15 mg by mouth daily.           Marland Kitchen oxymorphone (OPANA) 5 MG tablet   Oral   Take 5 mg by mouth once.           . propranolol (INDERAL) 80 MG tablet   Oral   Take 80 mg by mouth once.           . tapentadol (NUCYNTA) 50 MG TABS   Oral   Take 100 mg by mouth 2 (two) times daily.           . Tapentadol HCl (NUCYNTA ER) 50 MG TB12   Oral   Take 50 capsules by mouth.           Marland Kitchen  venlafaxine (EFFEXOR) 75 MG tablet   Oral   Take 75 mg by mouth 1 dose over 46 hours.             BP 120/75  Pulse 94  Temp(Src) 97.9 F (36.6 C) (Oral)  Resp 20  Ht 5\' 4"  (1.626 m)  Wt 185 lb (83.915 kg)  BMI 31.74 kg/m2  SpO2 99%  Physical Exam  Nursing note and vitals reviewed. Constitutional: She is oriented to person, place, and time. She appears well-developed and well-nourished. No distress.  Appears uncomfortable.  HENT:  Head: Normocephalic and atraumatic.  Right Ear: Tympanic membrane normal.  Left Ear: Tympanic membrane normal.  Nose: Nose normal.  Mouth/Throat: Uvula is midline, oropharynx is clear and moist and mucous membranes are normal.  Tender on palpation left forehead and around left eye.   Eyes: EOM are normal. Pupils are equal, round, and reactive to light. Right eye exhibits no nystagmus. Left eye exhibits no nystagmus.  Neck: Neck supple.  Cardiovascular: Normal rate  and regular rhythm.   Pulmonary/Chest: Effort normal and breath sounds normal. No respiratory distress.  Abdominal: Soft. There is no tenderness.  Musculoskeletal: Normal range of motion. She exhibits no edema.  Neurological: She is alert and oriented to person, place, and time. She has normal strength and normal reflexes. No cranial nerve deficit or sensory deficit. She displays a negative Romberg sign. Coordination normal.  Skin: Skin is warm and dry.  Psychiatric: She has a normal mood and affect. Her behavior is normal. Judgment and thought content normal.   Procedures   Assessment: 42 y.o. female with headache  Plan:  IV hydration, Zofran 4 mg, Toradol 15 mg. Dilaudid 1 mg IV   I discussed this case with Dr. Bernette Mayers    16:45 patient feeling some better but continues to have pain around the left eye at the area of her past surgery.  Will give a second dose of Dilaudid and Phenergan 12.5 mg IV  D/C home to follow up with PCP Discussed with the patient and all questioned fully answered. She will returnif any problems arise.    Medication List    ASK your doctor about these medications       clonazePAM 1 MG tablet  Commonly known as:  KLONOPIN  Take 1 mg by mouth 2 (two) times daily as needed.     diazepam 5 MG tablet  Commonly known as:  VALIUM  Take 5 mg by mouth once.     estradiol 0.05 MG/24HR  Commonly known as:  VIVELLE-DOT  Place 1 patch onto the skin once a week.     meloxicam 15 MG tablet  Commonly known as:  MOBIC  Take 15 mg by mouth daily.     NUCYNTA 50 MG Tabs  Generic drug:  tapentadol  Take 100 mg by mouth 2 (two) times daily.     NUCYNTA ER 50 MG Tb12  Generic drug:  Tapentadol HCl  Take 50 capsules by mouth.     oxymorphone 5 MG tablet  Commonly known as:  OPANA  Take 5 mg by mouth once.     propranolol 80 MG tablet  Commonly known as:  INDERAL  Take 80 mg by mouth once.     venlafaxine 75 MG tablet  Commonly known as:  EFFEXOR  Take 75 mg  by mouth 1 dose over 46 hours.            [facility] Orlene Och, [place] 04/20/12 [phone]

## 2012-04-21 NOTE — ED Provider Notes (Signed)
Medical screening examination/treatment/procedure(s) were performed by non-physician practitioner and as supervising physician I was immediately available for consultation/collaboration.    Vida Roller, MD 04/21/12 [phone]

## 2012-07-12 ENCOUNTER — Encounter (HOSPITAL_BASED_OUTPATIENT_CLINIC_OR_DEPARTMENT_OTHER): Payer: Self-pay | Admitting: *Deleted

## 2012-07-12 ENCOUNTER — Emergency Department (HOSPITAL_BASED_OUTPATIENT_CLINIC_OR_DEPARTMENT_OTHER)
Admission: EM | Admit: 2012-07-12 | Discharge: 2012-07-12 | Disposition: A | Discharge status: Home / Self Care | Payer: BC Managed Care – PPO | Attending: Emergency Medicine | Admitting: Emergency Medicine

## 2012-07-12 DIAGNOSIS — G43909 Migraine, unspecified, not intractable, without status migrainosus: Secondary | ICD-10-CM | POA: Insufficient documentation

## 2012-07-12 DIAGNOSIS — H53149 Visual discomfort, unspecified: Secondary | ICD-10-CM | POA: Insufficient documentation

## 2012-07-12 DIAGNOSIS — Z8669 Personal history of other diseases of the nervous system and sense organs: Secondary | ICD-10-CM | POA: Insufficient documentation

## 2012-07-12 DIAGNOSIS — Z8614 Personal history of Methicillin resistant Staphylococcus aureus infection: Secondary | ICD-10-CM | POA: Insufficient documentation

## 2012-07-12 DIAGNOSIS — Z8709 Personal history of other diseases of the respiratory system: Secondary | ICD-10-CM | POA: Insufficient documentation

## 2012-07-12 DIAGNOSIS — R110 Nausea: Secondary | ICD-10-CM | POA: Insufficient documentation

## 2012-07-12 DIAGNOSIS — Z79899 Other long term (current) drug therapy: Secondary | ICD-10-CM | POA: Insufficient documentation

## 2012-07-12 MED ORDER — HYDROMORPHONE HCL PF 2 MG/ML IJ SOLN
2.0000 mg | Freq: Once | INTRAMUSCULAR | Status: AC
  Administered 2012-07-12: 2 mg via INTRAVENOUS
  Filled 2012-07-12: qty 1

## 2012-07-12 MED ORDER — PROMETHAZINE HCL 25 MG/ML IJ SOLN
25.0000 mg | Freq: Once | INTRAMUSCULAR | Status: AC
  Administered 2012-07-12: 25 mg via INTRAVENOUS
  Filled 2012-07-12: qty 1

## 2012-07-12 MED ORDER — SODIUM CHLORIDE 0.9 % IV SOLN
Freq: Once | INTRAVENOUS | Status: AC
  Administered 2012-07-12: 19:00:00 via INTRAVENOUS

## 2012-07-12 MED ORDER — KETOROLAC TROMETHAMINE 30 MG/ML IJ SOLN
30.0000 mg | Freq: Once | INTRAMUSCULAR | Status: AC
  Administered 2012-07-12: 30 mg via INTRAVENOUS
  Filled 2012-07-12: qty 1

## 2012-07-12 NOTE — ED Notes (Signed)
MD at bedside. 

## 2012-07-12 NOTE — ED Provider Notes (Signed)
History    This chart was scribed for Hurman Horn, MD, by Frederik Pear, ED scribe. The patient was seen in room MH07/MH07 and the patient's care was started at 1753.    CSN: [id number]  Arrival date & time 07/12/12  1559   First MD Initiated Contact with Patient 07/12/12 1753      Chief Complaint  Patient presents with  . Migraine    (Consider location/radiation/quality/duration/timing/severity/associated sxs/prior treatment) The history is provided by the patient and medical records. No language interpreter was used.    HPI Comments: Cynthia Hester is a 42 y.o. female with a h/o of chronic headaches, trigeminal neuralgia, and sinusitis, as well as several eyes surgeries who presents to the Emergency Department complaining of a gradually worsening, worse than baseline, constant  migraine that began a week and a half ago with associated nausea and photophobia. She reports that the current migraine is similar to previous previous breakthrough headaches. She reports that most of her breakthrough HAs are able to be treated at home with her oxycodone and other medications prescribed by her neurologist and pain management provider and that ED visits only occur every few months. She denies sudden changes to vision or speech, weakness or numbness, nausea, or emesis. She has found previously that 2mg  of IV Dilaudid, 30 mg of IV Toradol, and 25 mg of IV Phenergan have provided her with relief. Her husband reports that she avoids steroid use because they make her extremely emotional.  Past Medical History  Diagnosis Date  . Chronic headaches   . Trigeminal neuralgia   . MRSA (methicillin resistant staph aureus) culture positive   . Sinusitis     Past Surgical History  Procedure Laterality Date  . Abdominal hysterectomy    . Eye surgery      History reviewed. No pertinent family history.  History  Substance Use Topics  . Smoking status: Never Smoker   . Smokeless tobacco: Not on  file  . Alcohol Use: No    OB History   Grav Para Term Preterm Abortions TAB SAB Ect Mult Living                  Review of Systems A complete 10 system review of systems was obtained and all systems are negative except as noted in the HPI and PMH.  Allergies  Enoxaparin sodium; Metoclopramide hcl; and Vancomycin  Home Medications   Current Outpatient Rx  Name  Route  Sig  Dispense  Refill  . sulfamethoxazole-trimethoprim (BACTRIM DS) 800-160 MG per tablet   Oral   Take 1 tablet by mouth 2 (two) times daily.         . clonazePAM (KLONOPIN) 1 MG tablet   Oral   Take 1 mg by mouth 2 (two) times daily as needed.          . diazepam (VALIUM) 5 MG tablet   Oral   Take 5 mg by mouth once.           Marland Kitchen estradiol (VIVELLE-DOT) 0.05 MG/24HR   Transdermal   Place 1 patch onto the skin once a week.         . meloxicam (MOBIC) 15 MG tablet   Oral   Take 15 mg by mouth daily.           Marland Kitchen oxymorphone (OPANA) 5 MG tablet   Oral   Take 5 mg by mouth once.           . propranolol (  INDERAL) 80 MG tablet   Oral   Take 80 mg by mouth once.           . tapentadol (NUCYNTA) 50 MG TABS   Oral   Take 100 mg by mouth 2 (two) times daily.           . Tapentadol HCl (NUCYNTA ER) 50 MG TB12   Oral   Take 50 capsules by mouth.           . venlafaxine (EFFEXOR) 75 MG tablet   Oral   Take 75 mg by mouth 1 dose over 46 hours.             BP 94/66  Pulse 75  Temp(Src) 98.2 F (36.8 C) (Oral)  Resp 16  Ht 5\' 4"  (1.626 m)  Wt 200 lb (90.719 kg)  BMI 34.31 kg/m2  SpO2 99%  Physical Exam  Nursing note and vitals reviewed. Constitutional:  Awake, alert, nontoxic appearance with baseline speech for patient.  HENT:  Head: Atraumatic.  Mouth/Throat: No oropharyngeal exudate.  Eyes: Conjunctivae are normal. Pupils are equal, round, and reactive to light. Right eye exhibits no discharge. Left eye exhibits no discharge. Right eye exhibits nystagmus. Left eye  exhibits nystagmus.  Normal field of vision. Known baseline, congenital lateral nystagmus with a fast twitch component that changes direction when the pt looks left or right. No rotary or vertical component. Negative test of skew.  Neck: Neck supple.  Cardiovascular: Normal rate, regular rhythm, normal heart sounds and intact distal pulses.  Exam reveals no gallop and no friction rub.   No murmur heard. Pulmonary/Chest: Effort normal and breath sounds normal. No stridor. No respiratory distress. She has no wheezes. She has no rales. She exhibits no tenderness.  Abdominal: Soft. Bowel sounds are normal. She exhibits no distension and no mass. There is no tenderness. There is no rebound and no guarding.  Musculoskeletal: She exhibits no tenderness.  Baseline ROM, moves extremities with no obvious new focal weakness.  Lymphadenopathy:    She has no cervical adenopathy.  Neurological:  Awake, alert, cooperative and aware of situation; motor strength bilaterally; sensation normal to light touch bilaterally; peripheral visual fields full to confrontation; no facial asymmetry; tongue midline; major cranial nerves appear intact; no pronator drift, normal finger to nose bilaterally, baseline gait without new ataxia.  Skin: No rash noted.  Psychiatric: She has a normal mood and affect.    ED Course  Procedures (including critical care time)  DIAGNOSTIC STUDIES: Oxygen Saturation is 97% on room air, adequate by my interpretation.    COORDINATION OF CARE:  18:00- Patient and family understand and agree with initial ED impression and plan with expectations set for ED visit, including Dilaudid, Toradol, and Phenergan.  18:15- Medication Orders- ketorolac (Toradol) 30 mg/ml injection 30 mg- once, hydromorphone (dilaudid) injection 2 mg- once, promethazine (phenergan) injection 25 mg- once.   19:00- Medication Orders- 0.9% sodium chloride infusion- once.  20:30- Medications Orders- hydromorphone  (dilaudid) injection 2 mg- once.  21:40- Pt stable in ED with no significant deterioration in condition. She reports that she is feeling much better and is ready for discharge. Patient and family informed of clinical course, understand medical decision-making process, and agree with plan.  Labs Reviewed - No data to display No results found.   1. Migraine headache       MDM  I personally performed the services described in this documentation, which was scribed in my presence. The recorded information has been  reviewed and is accurate. I doubt any other EMC precluding discharge at this time including, but not necessarily limited to the following:SAH, CVA, SBI.         Hurman Horn, MD 07/13/12 [phone]

## 2012-07-12 NOTE — ED Notes (Signed)
Pt has hx of migraines and has had this one since Thursday. PERRL. +light sensitivity, nausea, neck pain. Taking antibiotics for ?sinus inf.

## 2012-07-12 NOTE — ED Notes (Signed)
D/c home with ride 

## 2017-10-18 ENCOUNTER — Encounter: Payer: Self-pay | Admitting: Internal Medicine

## 2017-10-18 ENCOUNTER — Ambulatory Visit (INDEPENDENT_AMBULATORY_CARE_PROVIDER_SITE_OTHER): Payer: BLUE CROSS/BLUE SHIELD | Admitting: Internal Medicine

## 2017-10-18 VITALS — BP 88/72 | HR 79 | Ht 64.0 in | Wt 184.0 lb

## 2017-10-18 DIAGNOSIS — R51 Headache: Secondary | ICD-10-CM

## 2017-10-18 DIAGNOSIS — R112 Nausea with vomiting, unspecified: Secondary | ICD-10-CM

## 2017-10-18 DIAGNOSIS — E70329 Oculocutaneous albinism, unspecified: Secondary | ICD-10-CM

## 2017-10-18 DIAGNOSIS — R1013 Epigastric pain: Secondary | ICD-10-CM

## 2017-10-18 DIAGNOSIS — R768 Other specified abnormal immunological findings in serum: Secondary | ICD-10-CM

## 2017-10-18 DIAGNOSIS — R519 Headache, unspecified: Secondary | ICD-10-CM

## 2017-10-18 MED ORDER — PROCHLORPERAZINE 25 MG RE SUPP: 25.0000 mg | Freq: Two times a day (BID) | RECTAL | 1 refills | Status: AC | PRN

## 2017-10-18 NOTE — Patient Instructions (Addendum)
  You have been scheduled for an MRI at [facility] on 10/25/17. Your appointment time is 1:30pm. Please arrive 30 minutes prior to your appointment time for registration purposes. Please make certain not to have anything to eat or drink 6 hours prior to your test. In addition, if you have any metal in your body, have a pacemaker or defibrillator, please be sure to let your ordering physician know. This test typically takes 45 minutes to 1 hour to complete. Should you need to reschedule, please call [phone] to do so.    I appreciate the opportunity to care for you. Stan Headarl Gessner, MD, [facility]

## 2017-10-18 NOTE — Progress Notes (Signed)
Cynthia HutchingChristine Hester 47 y.o. 08/22/1970 [id number] Referred by: Cynthia Hester, Cynthia E, MD   Assessment & Plan:   Encounter Diagnoses  Name Primary?  . Intractable episodic headache, unspecified headache type Yes  . Intractable vomiting with nausea, unspecified vomiting type   . Helicobacter pylori antibody positive   . Epigastric pain, suspect abdominal wall pain from vomiting     I do not think this is a gastrointestinal problem.  The symptoms all seem to be triggered by headache.  The abdominal pain appears to be musculoskeletal and is likely from her vomiting.  There is clearly a situational stressor with the relatively recent death of her daughter.  Question if there is some relationship.  She has chronic headache problems with something is different here so I feel obligated to evaluate with an MRI with and without contrast, of the brain and will do so.  Generic Compazine suppositories are prescribed to use to treat her nausea and vomiting.  I told her to consider trying one prophylactically on Sunday night.  She can try bracing the abdominal wall heating pad and use topical salicylates for her abdominal wall pain.  [facility] since she has not heard.  I do not think she should be treated for a positive H. pylori serology.  That does not tell us if she has active H. pylori gastritis and her symptom complex does not sound at all like H. Pylori.  She does have abdominal pain and tenderness but it appears to be musculoskeletal and related to the vomiting which is related to headaches.  I appreciate the opportunity to care for this patient. CC: Cynthia Hester, Cynthia E, MD     Subjective:   Chief Complaint: Vomiting and headache  HPI The patient is a 47 year old divorced woman here with problems with nausea and vomiting.  She has had 3 episodes of severe diffuse headache followed by nausea and vomiting on each of the last 3  Mondays.  Temperature elevation to about 99 with this.  When she vomits the headache dissipates but she feels terrible for 3 or 4 days afterwards and is not better until the Friday or so.  She has a long history of chronic headaches and trigeminal neuralgia and is on chronic opioid treatment.  She does not have a known history of GERD or problems like that.  This is really a new problem.  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  The headache seems to dissipate after she vomits.  She may have had some migraines in the past.  She is tried promethazine p.o. which seems to help but it is difficult to keep that down when this happens.  Social history notable that her 49 year old daughter died recently for unclear causes question seizure, that was in June.  There is epigastric pain that began after the nausea and vomiting problems.  She does not have GI symptomatology otherwise.  She had blood test that showed a positive H. pylori IgG antibody negative IgG a and M.  She has been prescribed antibiotics.  CBC recently white count 11.5 otherwise normal.  Glucose was 130 chemistries otherwise normal.  [facility], MRI of the brain but was not notable for any significant problems.  [facility], [facility] and is now  not [facility] at [facility] and is still waiting to [facility].  This is for her trigeminal neuralgia pain and headaches and other neurologic issues. Allergies  Allergen Reactions  . Enoxaparin Sodium Hives  . Lovenox [Enoxaparin Sodium]   . Metoclopramide Hcl     Muscle cramps  . Morphine And Related   . Prednisone   . Vancomycin    Current Meds  Medication Sig  . aspirin 81 MG tablet Take 81 mg by mouth daily.  . clonazePAM (KLONOPIN) 1 MG tablet Take 1 mg by mouth 2 (two) times daily as needed.   . diazepam (VALIUM) 5 MG tablet  Take 5 mg by mouth once.    Marland Kitchen estradiol (VIVELLE-DOT) 0.05 MG/24HR Place 1 patch onto the skin once a week.  . hydrOXYzine (VISTARIL) 25 MG capsule Take 25 mg by mouth 3 (three) times daily as needed.  Marland Kitchen ketorolac (TORADOL) 30 MG/ML injection Inject 30 mg into the vein once.  . meloxicam (MOBIC) 15 MG tablet Take 15 mg by mouth daily.    . OXYCODONE-ACETAMINOPHEN PO Take by mouth.  . promethazine (PHENERGAN) 25 MG tablet Take 25 mg by mouth every 6 (six) hours as needed for nausea or vomiting.  . propranolol (INDERAL) 10 MG tablet Take 10 mg by mouth 3 (three) times daily.  . propranolol (INDERAL) 80 MG tablet Take 80 mg by mouth once.    . venlafaxine (EFFEXOR) 75 MG tablet Take 75 mg by mouth 1 dose over 46 hours.     Past Medical History:  Diagnosis Date  . Anxiety   . Attention deficit disorder (ADD) in adult   . B12 deficiency   . Blood clotting disorder (HCC)   . Chronic ethmoidal sinusitis   . Chronic headaches   . Chronic pain disorder   . Chronic sinusitis   . Common migraine   . Deep phlebitis-leg (HCC)   . History of DVT (deep vein thrombosis)   . Hypothyroidism   . Insomnia   . MRSA (methicillin resistant staph aureus) culture positive   . Narcolepsy   . Oculocutaneous albinism (HCC)   . Prothrombin gene mutation (HCC)   . Pulmonary emboli (HCC)   . Sinusitis   . Status post gamma knife treatment   . Trigeminal neuralgia syndrome    Past Surgical History:  Procedure Laterality Date  . ABDOMINAL HYSTERECTOMY    . EYE SURGERY    . 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   Review of Systems As per HPI.  All other review of systems appear negative at this point.  Objective:   Physical Exam @BP  (!) 88/72   Pulse 79   Ht 5\' 4"  (1.626 m)   Wt 184 lb (83.5 kg)   BMI 31.58 kg/m @  General:  Well-developed, well-nourished and in no acute distress Eyes:  anicteric. ENT:   Mouth and posterior pharynx free of lesions.  Neck:   supple w/o thyromegaly or mass.  Lungs: Clear to auscultation bilaterally. Heart:  S1S2, no rubs, murmurs, gallops. Abdomen:  soft,tender epigastrium worse with muscle tension i.Hester. positive carbonate sign, no hepatosplenomegaly, hernia, or mass and BS+.  Lymph:  no cervical or supraclavicular adenopathy. Extremities:   no edema, cyanosis or clubbing Skin   no rash palke - albinism. Neuro:  A&O x 3. nystagmus constant Psych:  appropriate mood and  Affect.   Data Reviewed: See HPI

## 2017-10-30 ENCOUNTER — Telehealth: Payer: Self-pay

## 2017-10-30 NOTE — Telephone Encounter (Signed)
I called and spoke with Cynthia Hester and she said she saw her PCP and was given medicine which made her better so she didn't need to do the MRI. She said it turned out to be a GI issue but she didn't say what her Dx was.

## 2017-10-30 NOTE — Telephone Encounter (Signed)
-----   Message from Iva Boop, MD sent at 10/30/2017  2:54 PM EDT ----- Regarding: no MRI? Please f/u re MRI not done  Thx

## 2018-04-04 ENCOUNTER — Encounter: Payer: BLUE CROSS/BLUE SHIELD | Admitting: Vascular Surgery
